# Patient Record
Sex: Male | Born: 1985 | Hispanic: No | Marital: Single | Smoking: Never smoker
Health system: Southern US, Community
[De-identification: ages and names within clinical notes are randomized; demographics above are authoritative.]

## PROBLEM LIST (undated history)

## (undated) HISTORY — PX: ABDOMINAL HYSTERECTOMY: SHX81

---

## 2015-08-07 ENCOUNTER — Emergency Department (HOSPITAL_COMMUNITY): Payer: Self-pay

## 2015-08-07 ENCOUNTER — Emergency Department (HOSPITAL_COMMUNITY): Payer: MEDICAID | Admitting: Anesthesiology

## 2015-08-07 ENCOUNTER — Inpatient Hospital Stay (HOSPITAL_COMMUNITY)
Admission: EM | Admit: 2015-08-07 | Discharge: 2015-08-20 | DRG: 004 | Payer: Self-pay | Attending: General Surgery | Admitting: General Surgery

## 2015-08-07 ENCOUNTER — Encounter (HOSPITAL_COMMUNITY): Payer: Self-pay | Admitting: *Deleted

## 2015-08-07 ENCOUNTER — Emergency Department (HOSPITAL_COMMUNITY): Payer: Self-pay | Admitting: Anesthesiology

## 2015-08-07 ENCOUNTER — Other Ambulatory Visit: Payer: Self-pay

## 2015-08-07 ENCOUNTER — Encounter (HOSPITAL_COMMUNITY): Admission: EM | Payer: Self-pay | Source: Home / Self Care

## 2015-08-07 DIAGNOSIS — S199XXA Unspecified injury of neck, initial encounter: Secondary | ICD-10-CM

## 2015-08-07 DIAGNOSIS — R739 Hyperglycemia, unspecified: Secondary | ICD-10-CM | POA: Diagnosis not present

## 2015-08-07 DIAGNOSIS — Z430 Encounter for attention to tracheostomy: Secondary | ICD-10-CM

## 2015-08-07 DIAGNOSIS — S15102A Unspecified injury of left vertebral artery, initial encounter: Secondary | ICD-10-CM | POA: Diagnosis present

## 2015-08-07 DIAGNOSIS — S12600B Unspecified displaced fracture of seventh cervical vertebra, initial encounter for open fracture: Secondary | ICD-10-CM | POA: Diagnosis present

## 2015-08-07 DIAGNOSIS — M25422 Effusion, left elbow: Secondary | ICD-10-CM

## 2015-08-07 DIAGNOSIS — J939 Pneumothorax, unspecified: Secondary | ICD-10-CM

## 2015-08-07 DIAGNOSIS — M25512 Pain in left shoulder: Secondary | ICD-10-CM

## 2015-08-07 DIAGNOSIS — W3400XA Accidental discharge from unspecified firearms or gun, initial encounter: Secondary | ICD-10-CM

## 2015-08-07 DIAGNOSIS — K5900 Constipation, unspecified: Secondary | ICD-10-CM | POA: Diagnosis not present

## 2015-08-07 DIAGNOSIS — D62 Acute posthemorrhagic anemia: Secondary | ICD-10-CM | POA: Diagnosis present

## 2015-08-07 DIAGNOSIS — S129XXA Fracture of neck, unspecified, initial encounter: Secondary | ICD-10-CM | POA: Diagnosis present

## 2015-08-07 DIAGNOSIS — S27819S Unspecified injury of esophagus (thoracic part), sequela: Secondary | ICD-10-CM

## 2015-08-07 DIAGNOSIS — S27819A Unspecified injury of esophagus (thoracic part), initial encounter: Secondary | ICD-10-CM | POA: Diagnosis present

## 2015-08-07 DIAGNOSIS — I4891 Unspecified atrial fibrillation: Secondary | ICD-10-CM | POA: Diagnosis not present

## 2015-08-07 DIAGNOSIS — M25522 Pain in left elbow: Secondary | ICD-10-CM

## 2015-08-07 DIAGNOSIS — S143XXA Injury of brachial plexus, initial encounter: Secondary | ICD-10-CM | POA: Diagnosis present

## 2015-08-07 DIAGNOSIS — J9690 Respiratory failure, unspecified, unspecified whether with hypoxia or hypercapnia: Secondary | ICD-10-CM

## 2015-08-07 DIAGNOSIS — S27818A Other injury of esophagus (thoracic part), initial encounter: Principal | ICD-10-CM | POA: Diagnosis present

## 2015-08-07 DIAGNOSIS — J982 Interstitial emphysema: Secondary | ICD-10-CM

## 2015-08-07 HISTORY — PX: TRACHEOSTOMY TUBE PLACEMENT: SHX814

## 2015-08-07 LAB — CDS SEROLOGY

## 2015-08-07 SURGERY — CREATION, TRACHEOSTOMY
Anesthesia: General | Site: Neck

## 2015-08-07 MED ORDER — SODIUM CHLORIDE 0.9 % IV SOLN
INTRAVENOUS | Status: DC | PRN
  Administered 2015-08-07: 22:00:00 via INTRAVENOUS

## 2015-08-07 MED ORDER — ETOMIDATE 2 MG/ML IV SOLN
0.3000 mg/kg | Freq: Once | INTRAVENOUS | Status: DC
  Administered 2015-08-07: 30 mg via INTRAVENOUS

## 2015-08-07 MED ORDER — FENTANYL CITRATE (PF) 250 MCG/5ML IJ SOLN
INTRAMUSCULAR | Status: AC
  Filled 2015-08-07: qty 5

## 2015-08-07 MED ORDER — IOHEXOL 350 MG/ML SOLN
100.0000 mL | Freq: Once | INTRAVENOUS | Status: AC | PRN
  Administered 2015-08-07: 100 mL via INTRAVENOUS

## 2015-08-07 MED ORDER — ROCURONIUM BROMIDE 100 MG/10ML IV SOLN
INTRAVENOUS | Status: DC | PRN
  Administered 2015-08-07: 50 mg via INTRAVENOUS

## 2015-08-07 MED ORDER — MIDAZOLAM HCL 2 MG/2ML IJ SOLN
INTRAMUSCULAR | Status: AC
Start: 2015-08-07 — End: 2015-08-07
  Administered 2015-08-07: 2 mg
  Filled 2015-08-07: qty 2

## 2015-08-07 MED ORDER — CEFAZOLIN SODIUM-DEXTROSE 2-3 GM-% IV SOLR
INTRAVENOUS | Status: DC | PRN
  Administered 2015-08-07: 2 g via INTRAVENOUS

## 2015-08-07 MED ORDER — FENTANYL CITRATE (PF) 250 MCG/5ML IJ SOLN
INTRAMUSCULAR | Status: DC | PRN
  Administered 2015-08-07: 100 ug via INTRAVENOUS
  Administered 2015-08-07: 50 ug via INTRAVENOUS
  Administered 2015-08-07: 100 ug via INTRAVENOUS

## 2015-08-07 MED ORDER — PHENYLEPHRINE HCL 10 MG/ML IJ SOLN
INTRAMUSCULAR | Status: DC | PRN
  Administered 2015-08-07 (×2): 40 ug via INTRAVENOUS

## 2015-08-07 MED ORDER — LIDOCAINE-EPINEPHRINE 1 %-1:100000 IJ SOLN
INTRAMUSCULAR | Status: AC
  Filled 2015-08-07: qty 1

## 2015-08-07 MED ORDER — PROPOFOL 10 MG/ML IV BOLUS
INTRAVENOUS | Status: AC
  Filled 2015-08-07: qty 40

## 2015-08-07 MED ORDER — PROPOFOL 1000 MG/100ML IV EMUL
INTRAVENOUS | Status: AC
Start: 2015-08-07 — End: 2015-08-07
  Administered 2015-08-07: 1000 mg
  Filled 2015-08-07: qty 100

## 2015-08-07 MED ORDER — SUCCINYLCHOLINE CHLORIDE 20 MG/ML IJ SOLN
100.0000 mg | Freq: Once | INTRAMUSCULAR | Status: AC
  Administered 2015-08-07: 100 mg via INTRAVENOUS

## 2015-08-07 MED ORDER — LACTATED RINGERS IV SOLN
INTRAVENOUS | Status: DC | PRN
  Administered 2015-08-07 (×2): via INTRAVENOUS

## 2015-08-07 MED ORDER — ROCURONIUM BROMIDE 50 MG/5ML IV SOLN: 1.0000 mg/kg | Freq: Once | INTRAVENOUS | Status: DC

## 2015-08-07 MED ORDER — ETOMIDATE 2 MG/ML IV SOLN
0.3000 mg/kg | Freq: Once | INTRAVENOUS | Status: DC
  Administered 2015-08-07: 20 mg via INTRAVENOUS

## 2015-08-07 SURGICAL SUPPLY — 32 items
BLADE SURG 15 STRL LF DISP TIS (BLADE) IMPLANT
BLADE SURG 15 STRL SS (BLADE)
CANISTER SUCTION 2500CC (MISCELLANEOUS) ×3 IMPLANT
CLEANER TIP ELECTROSURG 2X2 (MISCELLANEOUS) ×3 IMPLANT
COVER SURGICAL LIGHT HANDLE (MISCELLANEOUS) ×3 IMPLANT
CRADLE DONUT ADULT HEAD (MISCELLANEOUS) IMPLANT
DRSG TEGADERM 4X4.75 (GAUZE/BANDAGES/DRESSINGS) ×3 IMPLANT
ELECT CAUTERY BLADE 6.4 (BLADE) ×3 IMPLANT
ELECT REM PT RETURN 9FT ADLT (ELECTROSURGICAL) ×3
ELECTRODE REM PT RTRN 9FT ADLT (ELECTROSURGICAL) ×1 IMPLANT
GAUZE XEROFORM 1X8 LF (GAUZE/BANDAGES/DRESSINGS) ×3 IMPLANT
GLOVE BIO SURGEON STRL SZ7.5 (GLOVE) ×6 IMPLANT
GLOVE ECLIPSE 7.5 STRL STRAW (GLOVE) ×6 IMPLANT
GOWN STRL REUS W/ TWL LRG LVL3 (GOWN DISPOSABLE) ×2 IMPLANT
GOWN STRL REUS W/TWL LRG LVL3 (GOWN DISPOSABLE) ×4
KIT BASIN OR (CUSTOM PROCEDURE TRAY) ×3 IMPLANT
KIT ROOM TURNOVER OR (KITS) ×3 IMPLANT
NEEDLE 22X1 1/2 (OR ONLY) (NEEDLE) IMPLANT
NS IRRIG 1000ML POUR BTL (IV SOLUTION) ×3 IMPLANT
PAD ARMBOARD 7.5X6 YLW CONV (MISCELLANEOUS) ×6 IMPLANT
PENCIL BUTTON HOLSTER BLD 10FT (ELECTRODE) ×3 IMPLANT
PENCIL FOOT CONTROL (ELECTRODE) ×3 IMPLANT
SPONGE DRAIN TRACH 4X4 STRL 2S (GAUZE/BANDAGES/DRESSINGS) ×3 IMPLANT
SPONGE GAUZE 4X4 12PLY STER LF (GAUZE/BANDAGES/DRESSINGS) ×3 IMPLANT
SUT SILK 2 0 SH (SUTURE) ×9 IMPLANT
SYR CONTROL 10ML LL (SYRINGE) IMPLANT
TOWEL OR 17X24 6PK STRL BLUE (TOWEL DISPOSABLE) IMPLANT
TRAY ENT MC OR (CUSTOM PROCEDURE TRAY) ×3 IMPLANT
TUBE CONNECTING 12'X1/4 (SUCTIONS) ×1
TUBE CONNECTING 12X1/4 (SUCTIONS) ×2 IMPLANT
TUBE TRACH SHILEY 8 DIST CUF (TUBING) ×3 IMPLANT
YANKAUER SUCT BULB TIP NO VENT (SUCTIONS) ×3 IMPLANT

## 2015-08-07 NOTE — ED Notes (Signed)
Port chest 

## 2015-08-07 NOTE — ED Notes (Signed)
meds given to intubate  Difficult intubation
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
## 2015-08-07 NOTE — ED Notes (Signed)
Rolling the pt for surgeon to inspect
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
## 2015-08-07 NOTE — ED Notes (Signed)
Pro  25 mcg  75 kg estimated  Drip started   11.3 ml

## 2015-08-07 NOTE — ED Notes (Signed)
2 liters nss hung approx 45 minutes  ago

## 2015-08-07 NOTE — ED Notes (Signed)
Etomidate 20mg   Roc  70mg  iv matt rn

## 2015-08-07 NOTE — ED Notes (Signed)
Pt given etomidate 30 mg and succ 100mg 
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
## 2015-08-07 NOTE — ED Notes (Signed)
To c-t for ctas then to the or
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
## 2015-08-07 NOTE — ED Notes (Signed)
Pt  Being bagged through the cric

## 2015-08-07 NOTE — ED Notes (Signed)
Waiting for or to get ready

## 2015-08-08 ENCOUNTER — Encounter (HOSPITAL_COMMUNITY): Payer: Self-pay | Admitting: Radiology

## 2015-08-08 ENCOUNTER — Inpatient Hospital Stay (HOSPITAL_COMMUNITY): Payer: Self-pay

## 2015-08-08 DIAGNOSIS — W3400XA Accidental discharge from unspecified firearms or gun, initial encounter: Secondary | ICD-10-CM

## 2015-08-08 LAB — PREPARE FRESH FROZEN PLASMA
UNIT DIVISION: 0
Unit division: 0

## 2015-08-08 LAB — COMPREHENSIVE METABOLIC PANEL
ALK PHOS: 54 U/L (ref 38–126)
ALT: 30 U/L (ref 17–63)
ANION GAP: 15 (ref 5–15)
AST: 36 U/L (ref 15–41)
Albumin: 4 g/dL (ref 3.5–5.0)
BUN: 9 mg/dL (ref 6–20)
CALCIUM: 9.3 mg/dL (ref 8.9–10.3)
CHLORIDE: 108 mmol/L (ref 101–111)
CO2: 20 mmol/L — AB (ref 22–32)
Creatinine, Ser: 1.16 mg/dL (ref 0.61–1.24)
Glucose, Bld: 130 mg/dL — ABNORMAL HIGH (ref 65–99)
POTASSIUM: 3.2 mmol/L — AB (ref 3.5–5.1)
SODIUM: 143 mmol/L (ref 135–145)
Total Bilirubin: 0.4 mg/dL (ref 0.3–1.2)
Total Protein: 6.8 g/dL (ref 6.5–8.1)

## 2015-08-08 LAB — BASIC METABOLIC PANEL
ANION GAP: 14 (ref 5–15)
BUN: 7 mg/dL (ref 6–20)
CO2: 25 mmol/L (ref 22–32)
Calcium: 8.8 mg/dL — ABNORMAL LOW (ref 8.9–10.3)
Chloride: 106 mmol/L (ref 101–111)
Creatinine, Ser: 1.17 mg/dL (ref 0.61–1.24)
Glucose, Bld: 136 mg/dL — ABNORMAL HIGH (ref 65–99)
POTASSIUM: 4.4 mmol/L (ref 3.5–5.1)
SODIUM: 145 mmol/L (ref 135–145)

## 2015-08-08 LAB — MRSA PCR SCREENING: MRSA by PCR: NEGATIVE

## 2015-08-08 LAB — POCT I-STAT 3, ART BLOOD GAS (G3+)
Acid-base deficit: 7 mmol/L — ABNORMAL HIGH (ref 0.0–2.0)
Bicarbonate: 21.7 meq/L (ref 20.0–24.0)
O2 SAT: 96 %
PCO2 ART: 51.2 mmHg — AB (ref 35.0–45.0)
PH ART: 7.224 — AB (ref 7.350–7.450)
Patient temperature: 35.1
TCO2: 23 mmol/L (ref 0–100)
pO2, Arterial: 90 mmHg (ref 80.0–100.0)

## 2015-08-08 LAB — TYPE AND SCREEN
ABO/RH(D): O POS
ANTIBODY SCREEN: NEGATIVE
Unit division: 0
Unit division: 0

## 2015-08-08 LAB — CBC
HCT: 46.2 % (ref 39.0–52.0)
HEMATOCRIT: 46.4 % (ref 39.0–52.0)
HEMOGLOBIN: 15.2 g/dL (ref 13.0–17.0)
HEMOGLOBIN: 15.6 g/dL (ref 13.0–17.0)
MCH: 28.9 pg (ref 26.0–34.0)
MCH: 28.8 pg (ref 26.0–34.0)
MCHC: 32.9 g/dL (ref 30.0–36.0)
MCHC: 33.6 g/dL (ref 30.0–36.0)
MCV: 85.6 fL (ref 78.0–100.0)
MCV: 87.8 fL (ref 78.0–100.0)
PLATELETS: 245 10*3/uL (ref 150–400)
Platelets: 262 10*3/uL (ref 150–400)
RBC: 5.26 MIL/uL (ref 4.22–5.81)
RBC: 5.42 MIL/uL (ref 4.22–5.81)
RDW: 12.6 % (ref 11.5–15.5)
RDW: 12.8 % (ref 11.5–15.5)
WBC: 16.3 10*3/uL — ABNORMAL HIGH (ref 4.0–10.5)
WBC: 26.9 10*3/uL — AB (ref 4.0–10.5)

## 2015-08-08 LAB — PROTIME-INR
INR: 0.92 (ref 0.00–1.49)
PROTHROMBIN TIME: 12.6 s (ref 11.6–15.2)

## 2015-08-08 LAB — ETHANOL: ALCOHOL ETHYL (B): 46 mg/dL — AB (ref <5)

## 2015-08-08 LAB — ABO/RH: ABO/RH(D): O POS

## 2015-08-08 MED ORDER — ENOXAPARIN SODIUM 40 MG/0.4ML ~~LOC~~ SOLN
40.0000 mg | Freq: Every day | SUBCUTANEOUS | Status: DC
  Administered 2015-08-08 – 2015-08-19 (×12): 40 mg via SUBCUTANEOUS
  Filled 2015-08-08 (×12): qty 0.4

## 2015-08-08 MED ORDER — ANTISEPTIC ORAL RINSE SOLUTION (CORINZ)
7.0000 mL | OROMUCOSAL | Status: DC
  Administered 2015-08-08 – 2015-08-12 (×44): 7 mL via OROMUCOSAL

## 2015-08-08 MED ORDER — CHLORHEXIDINE GLUCONATE 0.12% ORAL RINSE (MEDLINE KIT)
15.0000 mL | Freq: Two times a day (BID) | OROMUCOSAL | Status: DC
  Administered 2015-08-08 – 2015-08-12 (×11): 15 mL via OROMUCOSAL

## 2015-08-08 MED ORDER — SODIUM CHLORIDE 0.9 % IV SOLN
10.0000 ug/h | INTRAVENOUS | Status: DC
  Administered 2015-08-08 (×2): 150 ug/h via INTRAVENOUS
  Administered 2015-08-09: 100 ug/h via INTRAVENOUS
  Administered 2015-08-10: 125 ug/h via INTRAVENOUS
  Administered 2015-08-11: 100 ug/h via INTRAVENOUS
  Filled 2015-08-08 (×6): qty 50

## 2015-08-08 MED ORDER — FENTANYL CITRATE (PF) 100 MCG/2ML IJ SOLN
100.0000 ug | Freq: Once | INTRAMUSCULAR | Status: AC
  Administered 2015-08-08: 100 ug via INTRAVENOUS

## 2015-08-08 MED ORDER — FENTANYL BOLUS VIA INFUSION
25.0000 ug | INTRAVENOUS | Status: DC | PRN
  Administered 2015-08-09 (×2): 25 ug via INTRAVENOUS
  Administered 2015-08-10 – 2015-08-13 (×5): 50 ug via INTRAVENOUS
  Filled 2015-08-08: qty 50

## 2015-08-08 MED ORDER — FENTANYL CITRATE (PF) 100 MCG/2ML IJ SOLN
INTRAMUSCULAR | Status: AC
  Filled 2015-08-08: qty 2

## 2015-08-08 MED ORDER — AMPICILLIN-SULBACTAM SODIUM 3 (2-1) G IJ SOLR
3.0000 g | Freq: Four times a day (QID) | INTRAMUSCULAR | Status: DC
  Administered 2015-08-08 – 2015-08-20 (×48): 3 g via INTRAVENOUS
  Filled 2015-08-08 (×54): qty 3

## 2015-08-08 MED ORDER — IOHEXOL 300 MG/ML  SOLN
75.0000 mL | Freq: Once | INTRAMUSCULAR | Status: AC | PRN
  Administered 2015-08-08: 75 mL via INTRAVENOUS

## 2015-08-08 MED ORDER — SODIUM CHLORIDE 0.9 % IV SOLN
0.0000 mg/h | INTRAVENOUS | Status: DC
  Administered 2015-08-08: 5 mg/h via INTRAVENOUS
  Administered 2015-08-08: 4 mg/h via INTRAVENOUS
  Administered 2015-08-08 – 2015-08-09 (×2): 5 mg/h via INTRAVENOUS
  Administered 2015-08-09: 3 mg/h via INTRAVENOUS
  Administered 2015-08-09: 2 mg/h via INTRAVENOUS
  Administered 2015-08-10: 3 mg/h via INTRAVENOUS
  Filled 2015-08-08 (×7): qty 10

## 2015-08-08 MED ORDER — DEXTROSE-NACL 5-0.9 % IV SOLN
INTRAVENOUS | Status: DC
  Administered 2015-08-08 – 2015-08-10 (×8): via INTRAVENOUS
  Administered 2015-08-13: 100 mL/h via INTRAVENOUS
  Administered 2015-08-14: 13:00:00 via INTRAVENOUS

## 2015-08-08 MED ORDER — MIDAZOLAM BOLUS VIA INFUSION
1.0000 mg | INTRAVENOUS | Status: DC | PRN
  Administered 2015-08-09 – 2015-08-10 (×3): 1 mg via INTRAVENOUS
  Filled 2015-08-08 (×4): qty 2

## 2015-08-08 NOTE — Progress Notes (Signed)
~  2045: [facility], Officer [place] about family visitation. Informed Officer that this nurse was informed that only the mother and sister (that was present Corina) was allowed to visit. Officer double checked on his end and said there was no issues with another sister visiting.  Officer Lorin PicketScott, [employer], discussed about visitation. Made clear that only 2 visitors at the time and immediate family only to visit pt. Will update mother in room and allow 2nd sister to visit.
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
## 2015-08-08 NOTE — Progress Notes (Signed)
CT cspine reviewed. The C7/T1 facets are aligned, no intracanal deformities appreciated. Alignment of cspine overall is normal. The Transverse process of C7 has been obliterated, but no structural issues at play. Would leave in collar for now. Neurological exam noted to be normal.

## 2015-08-08 NOTE — Progress Notes (Signed)
UR Completed. Anandi Abramo, RN, BSN.  [phone] 

## 2015-08-08 NOTE — Progress Notes (Signed)
Officer [place] [employer] identified patient as Dustin Heaporman Wayne House of [address], [name]. DOB 26-Jul-1985. SSN: [id number]  Detective Drue SecondSnider to return Monday to attempt to speak with patient. If needed, his number is [phone]

## 2015-08-08 NOTE — Anesthesia Postprocedure Evaluation (Signed)
Anesthesia Post Note  Patient: Dustin House  Procedure(s) Performed: Procedure(s) (LRB): TRACHEOSTOMY attempted bronchoscopy, closure of neck wound  (N/A)  Patient location during evaluation: PACU Anesthesia Type: General Post-procedure mental status: Trached and sedated. Pain management: pain level controlled Vital Signs Assessment: post-procedure vital signs reviewed and stable Respiratory status: Pt remains trached. Cardiovascular status: blood pressure returned to baseline and stable Postop Assessment: no signs of nausea or vomiting Anesthetic complications: no    Last Vitals:  Filed Vitals:   08/08/15 0145 08/08/15 0200  BP: 83/51 88/54  Pulse: 105 105  Temp:    Resp: 14 14    Last Pain:  Filed Vitals:   08/08/15 0206  PainSc: 10-Worst pain ever                 Kennieth RadFitzgerald, Jaylyn Iyer E
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
## 2015-08-08 NOTE — Progress Notes (Signed)
BP 91/60 mmHg  Pulse 104  Temp(Src) 100.4 F (38 C) (Axillary)  Resp 14  Ht 6\' 1"  (1.854 m)  Wt 179 lb 3.7 oz (81.3 kg)  BMI 23.65 kg/m2  SpO2 99% Alert and  follows commands Moving all extremities Remain in collar.

## 2015-08-08 NOTE — Progress Notes (Signed)
Transported pt to ct and back on ventilator.  Trip was uneventful

## 2015-08-09 ENCOUNTER — Encounter (HOSPITAL_COMMUNITY): Payer: Self-pay | Admitting: General Surgery

## 2015-08-09 ENCOUNTER — Inpatient Hospital Stay (HOSPITAL_COMMUNITY): Payer: Self-pay

## 2015-08-09 ENCOUNTER — Encounter (HOSPITAL_COMMUNITY): Admission: EM | Payer: Self-pay | Source: Home / Self Care

## 2015-08-09 ENCOUNTER — Inpatient Hospital Stay (HOSPITAL_COMMUNITY): Payer: MEDICAID

## 2015-08-09 HISTORY — PX: ESOPHAGOGASTRODUODENOSCOPY: SHX5428

## 2015-08-09 LAB — CBC
HCT: 36.4 % — ABNORMAL LOW (ref 39.0–52.0)
HEMOGLOBIN: 12.2 g/dL — AB (ref 13.0–17.0)
MCH: 29.5 pg (ref 26.0–34.0)
MCHC: 33.5 g/dL (ref 30.0–36.0)
MCV: 87.9 fL (ref 78.0–100.0)
Platelets: 124 10*3/uL — ABNORMAL LOW (ref 150–400)
RBC: 4.14 MIL/uL — AB (ref 4.22–5.81)
RDW: 13.4 % (ref 11.5–15.5)
WBC: 14.2 10*3/uL — AB (ref 4.0–10.5)

## 2015-08-09 LAB — BASIC METABOLIC PANEL
ANION GAP: 10 (ref 5–15)
CHLORIDE: 111 mmol/L (ref 101–111)
CO2: 23 mmol/L (ref 22–32)
Calcium: 7.9 mg/dL — ABNORMAL LOW (ref 8.9–10.3)
Creatinine, Ser: 1.04 mg/dL (ref 0.61–1.24)
GFR calc Af Amer: >60 mL/min (ref >60)
Glucose, Bld: 134 mg/dL — ABNORMAL HIGH (ref 65–99)
POTASSIUM: 3.5 mmol/L (ref 3.5–5.1)
SODIUM: 144 mmol/L (ref 135–145)

## 2015-08-09 LAB — BLOOD PRODUCT ORDER (VERBAL) VERIFICATION

## 2015-08-09 SURGERY — EGD (ESOPHAGOGASTRODUODENOSCOPY)
Anesthesia: Moderate Sedation

## 2015-08-09 MED ORDER — VECURONIUM BROMIDE 10 MG IV SOLR
10.0000 mg | Freq: Once | INTRAVENOUS | Status: AC
  Administered 2015-08-09: 10 mg via INTRAVENOUS

## 2015-08-09 MED ORDER — MIDAZOLAM BOLUS VIA INFUSION
2.0000 mg | Freq: Once | INTRAVENOUS | Status: AC
  Administered 2015-08-09: 2 mg via INTRAVENOUS

## 2015-08-09 MED ORDER — FENTANYL BOLUS VIA INFUSION
100.0000 ug | Freq: Once | INTRAVENOUS | Status: AC
  Administered 2015-08-09: 100 ug via INTRAVENOUS

## 2015-08-09 MED ORDER — ACETAMINOPHEN 650 MG RE SUPP
650.0000 mg | Freq: Four times a day (QID) | RECTAL | Status: DC | PRN
  Administered 2015-08-10: 650 mg via RECTAL
  Filled 2015-08-09: qty 1

## 2015-08-09 MED ORDER — VECURONIUM BROMIDE 10 MG IV SOLR
INTRAVENOUS | Status: AC
  Administered 2015-08-09: 10 mg via INTRAVENOUS
  Filled 2015-08-09: qty 10

## 2015-08-09 MED ORDER — INFLUENZA VAC SPLIT QUAD 0.5 ML IM SUSY
0.5000 mL | PREFILLED_SYRINGE | INTRAMUSCULAR | Status: AC
  Administered 2015-08-10: 0.5 mL via INTRAMUSCULAR
  Filled 2015-08-09: qty 0.5
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
## 2015-08-09 NOTE — Progress Notes (Signed)
Patient ID: Dustin House, male   DOB: 07-09-1985, 30 y.o.   MRN: [id number] GI not comfortable with flex EGD (air insufflation). We will need gastrograffin swallow when able.
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
## 2015-08-09 NOTE — Progress Notes (Addendum)
BP 135/69 mmHg  Pulse 109  Temp(Src) 100.3 F (37.9 C) (Oral)  Resp 13  Ht 6\' 1"  (1.854 m)  Wt 179 lb 3.7 oz (81.3 kg)  BMI 23.65 kg/m2  SpO2 100% Alert, does have sedation running Moving all extremities, edema in left upper extremity, especially left hand Follows all commands No new recs. Neck can be extended if needed for diagnostic evaluation.

## 2015-08-09 NOTE — Progress Notes (Signed)
VSS during bedside esophageal gastroscopy. See flowsheet.

## 2015-08-10 MED ORDER — PANTOPRAZOLE SODIUM 40 MG IV SOLR
40.0000 mg | Freq: Every day | INTRAVENOUS | Status: DC
  Administered 2015-08-10 – 2015-08-14 (×5): 40 mg via INTRAVENOUS
  Filled 2015-08-10 (×5): qty 40
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
## 2015-08-10 NOTE — Progress Notes (Signed)
Arrived to discuss PICC placement with patient. When discussing what a PICC was and where the tip would lie, the patient motioned that he did not want a PICC. Primary RN notified.

## 2015-08-10 NOTE — Progress Notes (Signed)
Patient ID: Dustin House, male   DOB: 12/12/1985, 29 y.o.   MRN: [id number] He is awake and alert, able to answer questions by nodding. He has been swallowing saliva without difficulty. He has less subcut air in the neck today. There is no swelling or signs of abscess. Will schedule for triple endoscopy later today or tomorrow depending on availability. 

## 2015-08-10 NOTE — Progress Notes (Signed)
Patient is currently off of the ventilator and on a 40% trach collar. RT will continue to monitor.
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
## 2015-08-10 NOTE — Progress Notes (Signed)
eLink Physician-Brief Progress Note Patient Name: Dustin House DOB: Apr 16, 1986 MRN: [id number]   Date of Service  08/10/2015  HPI/Events of Note  Intubated and mechanically ventilated. No stress ulcer prophylaxis.   eICU Interventions  Will order Protonix IV.      Intervention Category Intermediate Interventions: Best-practice therapies (e.g. DVT, beta blocker, etc.)  [name] 08/10/2015, 4:06 AM

## 2015-08-11 ENCOUNTER — Encounter (HOSPITAL_COMMUNITY): Payer: Self-pay | Admitting: *Deleted

## 2015-08-11 ENCOUNTER — Inpatient Hospital Stay (HOSPITAL_COMMUNITY): Payer: Self-pay

## 2015-08-11 ENCOUNTER — Inpatient Hospital Stay (HOSPITAL_COMMUNITY): Payer: Self-pay | Admitting: Anesthesiology

## 2015-08-11 ENCOUNTER — Inpatient Hospital Stay (HOSPITAL_COMMUNITY): Payer: MEDICAID | Admitting: Anesthesiology

## 2015-08-11 ENCOUNTER — Ambulatory Visit: Payer: Self-pay | Admitting: Otolaryngology

## 2015-08-11 ENCOUNTER — Encounter (HOSPITAL_COMMUNITY): Admission: EM | Payer: Self-pay | Source: Home / Self Care

## 2015-08-11 HISTORY — PX: RIGID ESOPHAGOSCOPY: SHX5226

## 2015-08-11 HISTORY — PX: DIRECT LARYNGOSCOPY: SHX5326

## 2015-08-11 SURGERY — LARYNGOSCOPY, DIRECT
Anesthesia: General

## 2015-08-11 MED ORDER — BACITRACIN ZINC 500 UNIT/GM EX OINT
TOPICAL_OINTMENT | CUTANEOUS | Status: AC
  Filled 2015-08-11: qty 28.35

## 2015-08-11 MED ORDER — ROCURONIUM BROMIDE 100 MG/10ML IV SOLN
INTRAVENOUS | Status: DC | PRN
  Administered 2015-08-11: 50 mg via INTRAVENOUS

## 2015-08-11 MED ORDER — SODIUM CHLORIDE 0.9% FLUSH
10.0000 mL | Freq: Two times a day (BID) | INTRAVENOUS | Status: DC
  Administered 2015-08-11 – 2015-08-20 (×9): 10 mL

## 2015-08-11 MED ORDER — PROMETHAZINE HCL 25 MG/ML IJ SOLN
12.5000 mg | INTRAMUSCULAR | Status: DC | PRN
  Administered 2015-08-11: 12.5 mg via INTRAVENOUS
  Filled 2015-08-11: qty 1

## 2015-08-11 MED ORDER — LIDOCAINE-EPINEPHRINE 1 %-1:100000 IJ SOLN
INTRAMUSCULAR | Status: AC
  Filled 2015-08-11: qty 1

## 2015-08-11 MED ORDER — MIDAZOLAM HCL 2 MG/2ML IJ SOLN
INTRAMUSCULAR | Status: AC
  Filled 2015-08-11: qty 2

## 2015-08-11 MED ORDER — SUGAMMADEX SODIUM 500 MG/5ML IV SOLN
INTRAVENOUS | Status: DC | PRN
  Administered 2015-08-11: 325.2 mg via INTRAVENOUS

## 2015-08-11 MED ORDER — SODIUM CHLORIDE 0.9% FLUSH
10.0000 mL | INTRAVENOUS | Status: DC | PRN
  Administered 2015-08-16 – 2015-08-17 (×2): 10 mL
  Filled 2015-08-11 (×2): qty 40

## 2015-08-11 MED ORDER — LIDOCAINE HCL (CARDIAC) 20 MG/ML IV SOLN
INTRAVENOUS | Status: DC | PRN
  Administered 2015-08-11: 80 mg via INTRAVENOUS

## 2015-08-11 MED ORDER — 0.9 % SODIUM CHLORIDE (POUR BTL) OPTIME
TOPICAL | Status: DC | PRN
  Administered 2015-08-11: 1000 mL

## 2015-08-11 MED ORDER — ONDANSETRON HCL 4 MG/2ML IJ SOLN
INTRAMUSCULAR | Status: DC | PRN
  Administered 2015-08-11: 4 mg via INTRAVENOUS

## 2015-08-11 MED ORDER — DEXMEDETOMIDINE HCL IN NACL 200 MCG/50ML IV SOLN
INTRAVENOUS | Status: AC
  Filled 2015-08-11: qty 50

## 2015-08-11 MED ORDER — FENTANYL CITRATE (PF) 100 MCG/2ML IJ SOLN
INTRAMUSCULAR | Status: DC | PRN
  Administered 2015-08-11: 250 ug via INTRAVENOUS

## 2015-08-11 MED ORDER — FENTANYL CITRATE (PF) 250 MCG/5ML IJ SOLN
INTRAMUSCULAR | Status: AC
  Filled 2015-08-11: qty 5

## 2015-08-11 MED ORDER — DEXMEDETOMIDINE HCL 200 MCG/2ML IV SOLN
INTRAVENOUS | Status: DC | PRN
  Administered 2015-08-11 (×3): 12 ug via INTRAVENOUS

## 2015-08-11 MED ORDER — LORAZEPAM 2 MG/ML IJ SOLN
0.5000 mg | INTRAMUSCULAR | Status: DC | PRN
  Administered 2015-08-11 – 2015-08-20 (×35): 1 mg via INTRAVENOUS
  Filled 2015-08-11 (×35): qty 1

## 2015-08-11 MED ORDER — MIDAZOLAM HCL 5 MG/5ML IJ SOLN
INTRAMUSCULAR | Status: DC | PRN
  Administered 2015-08-11: 2 mg via INTRAVENOUS

## 2015-08-11 MED ORDER — SUGAMMADEX SODIUM 500 MG/5ML IV SOLN
INTRAVENOUS | Status: AC
  Filled 2015-08-11: qty 5

## 2015-08-11 MED ORDER — PROPOFOL 10 MG/ML IV BOLUS
INTRAVENOUS | Status: AC
  Filled 2015-08-11: qty 20

## 2015-08-11 MED ORDER — LACTATED RINGERS IV SOLN
INTRAVENOUS | Status: DC | PRN
  Administered 2015-08-11: 12:00:00 via INTRAVENOUS

## 2015-08-11 SURGICAL SUPPLY — 61 items
ATTRACTOMAT 16X20 MAGNETIC DRP (DRAPES) IMPLANT
BALLN PULM 15 16.5 18 X 75CM (BALLOONS) ×1
BALLN PULM 15 16.5 18X75 (BALLOONS) ×2
BALLOON PULM 15 16.5 18X75 (BALLOONS) ×1 IMPLANT
BLADE SURG 15 STRL LF DISP TIS (BLADE) IMPLANT
BLADE SURG 15 STRL SS (BLADE)
BNDG CONFORM 2 STRL LF (GAUZE/BANDAGES/DRESSINGS) IMPLANT
BNDG GAUZE ELAST 4 BULKY (GAUZE/BANDAGES/DRESSINGS) IMPLANT
CANISTER SUCTION 2500CC (MISCELLANEOUS) ×3 IMPLANT
CATH ROBINSON RED A/P 16FR (CATHETERS) IMPLANT
CLEANER TIP ELECTROSURG 2X2 (MISCELLANEOUS) ×3 IMPLANT
CONT SPEC 4OZ CLIKSEAL STRL BL (MISCELLANEOUS) ×3 IMPLANT
COVER MAYO STAND STRL (DRAPES) ×3 IMPLANT
COVER SURGICAL LIGHT HANDLE (MISCELLANEOUS) ×3 IMPLANT
COVER TABLE BACK 60X90 (DRAPES) ×3 IMPLANT
DRAIN PENROSE 1/4X12 LTX STRL (WOUND CARE) ×3 IMPLANT
DRAPE PROXIMA HALF (DRAPES) ×3 IMPLANT
DRSG EMULSION OIL 3X3 NADH (GAUZE/BANDAGES/DRESSINGS) ×3 IMPLANT
ELECT COATED BLADE 2.86 ST (ELECTRODE) ×3 IMPLANT
ELECT NEEDLE TIP 2.8 STRL (NEEDLE) IMPLANT
ELECT REM PT RETURN 9FT ADLT (ELECTROSURGICAL) ×3
ELECTRODE REM PT RTRN 9FT ADLT (ELECTROSURGICAL) ×1 IMPLANT
GAUZE PACKING IODOFORM 1/4X5 (PACKING) ×3 IMPLANT
GAUZE SPONGE 4X4 12PLY STRL (GAUZE/BANDAGES/DRESSINGS) ×3 IMPLANT
GAUZE SPONGE 4X4 16PLY XRAY LF (GAUZE/BANDAGES/DRESSINGS) ×3 IMPLANT
GLOVE ECLIPSE 7.5 STRL STRAW (GLOVE) ×3 IMPLANT
GOWN STRL REUS W/ TWL LRG LVL3 (GOWN DISPOSABLE) ×2 IMPLANT
GOWN STRL REUS W/TWL LRG LVL3 (GOWN DISPOSABLE) ×4
GUARD TEETH (MISCELLANEOUS) IMPLANT
H R LUBE JELLY XXX (MISCELLANEOUS) ×3 IMPLANT
HOLDER TRACH TUBE VELCRO 19.5 (MISCELLANEOUS) ×3 IMPLANT
KIT BASIN OR (CUSTOM PROCEDURE TRAY) ×3 IMPLANT
KIT ROOM TURNOVER OR (KITS) ×3 IMPLANT
MARKER SKIN DUAL TIP RULER LAB (MISCELLANEOUS) ×3 IMPLANT
NEEDLE 27GAX1X1/2 (NEEDLE) IMPLANT
NEEDLE HYPO 30X.5 LL (NEEDLE) ×3 IMPLANT
NS IRRIG 1000ML POUR BTL (IV SOLUTION) ×3 IMPLANT
PAD ARMBOARD 7.5X6 YLW CONV (MISCELLANEOUS) ×6 IMPLANT
PATTIES SURGICAL .5 X3 (DISPOSABLE) ×3 IMPLANT
PENCIL FOOT CONTROL (ELECTRODE) ×3 IMPLANT
POUCH STERILIZING 3 X22 (STERILIZATION PRODUCTS) IMPLANT
SOLUTION ANTI FOG 6CC (MISCELLANEOUS) IMPLANT
SPECIMEN JAR SMALL (MISCELLANEOUS) ×3 IMPLANT
SUT CHROMIC 4 0 P 3 18 (SUTURE) ×3 IMPLANT
SUT ETHILON 4 0 PS 2 18 (SUTURE) ×3 IMPLANT
SUT ETHILON 5 0 P 3 18 (SUTURE) ×2
SUT NYLON ETHILON 5-0 P-3 1X18 (SUTURE) ×1 IMPLANT
SUT SILK 4 0 REEL (SUTURE) ×3 IMPLANT
SWAB COLLECTION DEVICE MRSA (MISCELLANEOUS) ×3 IMPLANT
SYR BULB IRRIGATION 50ML (SYRINGE) IMPLANT
SYR CONTROL 10ML LL (SYRINGE) ×3 IMPLANT
SYR TB 1ML LUER SLIP (SYRINGE) IMPLANT
TOWEL OR 17X24 6PK STRL BLUE (TOWEL DISPOSABLE) ×3 IMPLANT
TOWEL OR 17X26 10 PK STRL BLUE (TOWEL DISPOSABLE) ×3 IMPLANT
TRAY ENT MC OR (CUSTOM PROCEDURE TRAY) ×3 IMPLANT
TUBE ANAEROBIC SPECIMEN COL (MISCELLANEOUS) ×3 IMPLANT
TUBE CONNECTING 12'X1/4 (SUCTIONS) ×1
TUBE CONNECTING 12X1/4 (SUCTIONS) ×2 IMPLANT
TUBE TRACH SHILEY 8 NFEN UNCF (TUBING) ×3 IMPLANT
WATER STERILE IRR 1000ML POUR (IV SOLUTION) ×3 IMPLANT
YANKAUER SUCT BULB TIP NO VENT (SUCTIONS) IMPLANT

## 2015-08-11 NOTE — Progress Notes (Signed)
Patient ID: Dustin House, male   DOB: [birth year], 30 y.o.   MRN: [id number]   Barium swallow reviewed. Very small leak posterior left side. Recommend keep NPO and start tube feeds. Wil re-evaluate with another swallow in a week. No clinical need to drain the neck. Leak appears very small is is likely going to self-seal.

## 2015-08-11 NOTE — Progress Notes (Signed)
BP 126/59 mmHg  Pulse 69  Temp(Src) 98.2 F (36.8 C) (Oral)  Resp 17  Ht 6\' 1"  (1.854 m)  Wt 81.3 kg (179 lb 3.7 oz)  BMI 23.65 kg/m2  SpO2 100% Alert oriented Has had multiple procedures today Stable exam

## 2015-08-11 NOTE — Anesthesia Procedure Notes (Signed)
Date/Time: 08/11/2015 1:15 PM Performed by: Marena ChancyBECKNER, Anthon Harpole S Pre-anesthesia Checklist: Patient identified, Patient being monitored, Emergency Drugs available, Timeout performed and Suction available Patient Re-evaluated:Patient Re-evaluated prior to inductionOxygen Delivery Method: Circle system utilized Preoxygenation: Pre-oxygenation with 100% oxygen Intubation Type: Tracheostomy and Inhalational induction
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
## 2015-08-11 NOTE — Transfer of Care (Signed)
Immediate Anesthesia Transfer of Care Note  Patient: Dustin House  Procedure(s) Performed: Procedure(s): DIRECT LARYNGOSCOPY (N/A) RIGID ESOPHAGOSCOPY (N/A)  Patient Location: PACU  Anesthesia Type:General  Level of Consciousness: awake, alert  and oriented  Airway & Oxygen Therapy: Patient Spontanous Breathing and Patient connected to tracheostomy mask oxygen  Post-op Assessment: Report given to RN and Post -op Vital signs reviewed and stable  Post vital signs: Reviewed and stable  Last Vitals:  Filed Vitals:   08/11/15 1143 08/11/15 1400  BP: 124/72 142/98  Pulse: 79 76  Temp:  36.7 C  Resp: 19 26    Complications: No apparent anesthesia complications
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
## 2015-08-11 NOTE — Anesthesia Postprocedure Evaluation (Addendum)
Anesthesia Post Note  Patient: Dustin House  Procedure(s) Performed: Procedure(s) (LRB): DIRECT LARYNGOSCOPY (N/A) RIGID ESOPHAGOSCOPY (N/A)  Patient location during evaluation: SICU Anesthesia Type: General Level of consciousness: sedated Pain management: pain level controlled Vital Signs Assessment: post-procedure vital signs reviewed and stable Respiratory status: patient remains intubated per anesthesia plan (trach) Cardiovascular status: stable Postop Assessment: no signs of nausea or vomiting Anesthetic complications: no    Last Vitals:  Filed Vitals:   08/11/15 1519 08/11/15 1520  BP: 126/59 126/59  Pulse: 79 69  Temp:    Resp: 15 17    Last Pain:  Filed Vitals:   08/11/15 1603  PainSc: Asleep                 Jamoni Broadfoot
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
## 2015-08-11 NOTE — H&P (View-Only) (Signed)
Patient ID: Dustin House, male   DOB: 1985/07/04, 30 y.o.   MRN: [id number] He is awake and alert, able to answer questions by nodding. He has been swallowing saliva without difficulty. He has less subcut air in the neck today. There is no swelling or signs of abscess. Will schedule for triple endoscopy later today or tomorrow depending on availability.

## 2015-08-11 NOTE — Progress Notes (Signed)
Peripherally Inserted Central Catheter/Midline Placement  The IV Nurse has discussed with the patient and/or persons authorized to consent for the patient, the purpose of this procedure and the potential benefits and risks involved with this procedure.  The benefits include less needle sticks, lab draws from the catheter and patient may be discharged home with the catheter.  Risks include, but not limited to, infection, bleeding, blood clot (thrombus formation), and puncture of an artery; nerve damage and irregular heat beat.  Alternatives to this procedure were also discussed.  Consent obtained by Lazarus Gowdaenee Newman, RN  PICC/Midline Placement Documentation  PICC Double Lumen 08/11/15 PICC Right Basilic 43 cm 1 cm (Active)  Indication for Insertion or Continuance of Line Limited venous access - need for IV therapy >5 days (PICC only) 08/11/2015  5:23 PM  Exposed Catheter (cm) 1 cm 08/11/2015  5:23 PM  Dressing Change Due 08/18/15 08/11/2015  5:23 PM       Deziree Mokry, Lajean ManesKerry Loraine 08/11/2015, 5:24 PM
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
## 2015-08-12 ENCOUNTER — Encounter (HOSPITAL_COMMUNITY): Payer: Self-pay | Admitting: Otolaryngology

## 2015-08-12 ENCOUNTER — Inpatient Hospital Stay (HOSPITAL_COMMUNITY): Payer: Self-pay

## 2015-08-12 LAB — CBC WITH DIFFERENTIAL/PLATELET
Basophils Absolute: 0 10*3/uL (ref 0.0–0.1)
Basophils Relative: 0 %
EOS ABS: 0.2 10*3/uL (ref 0.0–0.7)
EOS PCT: 3 %
HCT: 32.6 % — ABNORMAL LOW (ref 39.0–52.0)
Hemoglobin: 11.3 g/dL — ABNORMAL LOW (ref 13.0–17.0)
LYMPHS ABS: 1.6 10*3/uL (ref 0.7–4.0)
Lymphocytes Relative: 22 %
MCH: 29.2 pg (ref 26.0–34.0)
MCHC: 34.7 g/dL (ref 30.0–36.0)
MCV: 84.2 fL (ref 78.0–100.0)
Monocytes Absolute: 0.7 10*3/uL (ref 0.1–1.0)
Monocytes Relative: 9 %
Neutro Abs: 4.7 10*3/uL (ref 1.7–7.7)
Neutrophils Relative %: 66 %
PLATELETS: 182 10*3/uL (ref 150–400)
RBC: 3.87 MIL/uL — AB (ref 4.22–5.81)
RDW: 12.4 % (ref 11.5–15.5)
WBC: 7.2 10*3/uL (ref 4.0–10.5)

## 2015-08-12 LAB — BASIC METABOLIC PANEL
Anion gap: 8 (ref 5–15)
CO2: 27 mmol/L (ref 22–32)
CREATININE: 0.87 mg/dL (ref 0.61–1.24)
Calcium: 8.7 mg/dL — ABNORMAL LOW (ref 8.9–10.3)
Chloride: 106 mmol/L (ref 101–111)
GFR calc Af Amer: >60 mL/min (ref >60)
GFR calc non Af Amer: >60 mL/min (ref >60)
Glucose, Bld: 102 mg/dL — ABNORMAL HIGH (ref 65–99)
Potassium: 3.1 mmol/L — ABNORMAL LOW (ref 3.5–5.1)
SODIUM: 141 mmol/L (ref 135–145)

## 2015-08-12 LAB — GLUCOSE, CAPILLARY
Glucose-Capillary: 98 mg/dL (ref 65–99)
Glucose-Capillary: 91 mg/dL (ref 65–99)

## 2015-08-12 MED ORDER — OXYCODONE HCL 5 MG/5ML PO SOLN
10.0000 mg | ORAL | Status: DC | PRN
  Administered 2015-08-12 – 2015-08-15 (×10): 10 mg
  Filled 2015-08-12 (×10): qty 10

## 2015-08-12 MED ORDER — PIVOT 1.5 CAL PO LIQD
1000.0000 mL | ORAL | Status: DC
  Administered 2015-08-12: 1000 mL
  Filled 2015-08-12: qty 1000

## 2015-08-12 MED ORDER — FENTANYL CITRATE (PF) 100 MCG/2ML IJ SOLN
25.0000 ug | INTRAMUSCULAR | Status: DC | PRN
  Administered 2015-08-13 – 2015-08-15 (×9): 50 ug via INTRAVENOUS
  Filled 2015-08-12 (×9): qty 2

## 2015-08-12 MED ORDER — CETYLPYRIDINIUM CHLORIDE 0.05 % MT LIQD
7.0000 mL | Freq: Two times a day (BID) | OROMUCOSAL | Status: DC
  Administered 2015-08-13 – 2015-08-19 (×10): 7 mL via OROMUCOSAL

## 2015-08-12 MED ORDER — CHLORHEXIDINE GLUCONATE 0.12 % MT SOLN
15.0000 mL | Freq: Two times a day (BID) | OROMUCOSAL | Status: DC
  Administered 2015-08-13 – 2015-08-19 (×12): 15 mL via OROMUCOSAL
  Filled 2015-08-12 (×11): qty 15

## 2015-08-12 MED ORDER — GABAPENTIN 250 MG/5ML PO SOLN
200.0000 mg | Freq: Three times a day (TID) | ORAL | Status: DC
  Administered 2015-08-12 – 2015-08-14 (×6): 200 mg
  Filled 2015-08-12 (×6): qty 4
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
## 2015-08-12 NOTE — Progress Notes (Signed)
Patient ID: Dustin House, male   DOB: August 13, 1985, 30 y.o.   MRN: [id number] X-rays reviewed. LUE and L shoulder pain may be due to L brachial plexus contusion or injury. Will try neurontin as we cannot give Lyrica per tube. I D/W him. Violeta GelinasBurke Raylyn Speckman, MD, MPH, FACS Trauma: [phone] General Surgery: [phone]

## 2015-08-13 ENCOUNTER — Inpatient Hospital Stay (HOSPITAL_COMMUNITY): Payer: Self-pay

## 2015-08-13 LAB — CBC
HCT: 31.5 % — ABNORMAL LOW (ref 39.0–52.0)
Hemoglobin: 11.2 g/dL — ABNORMAL LOW (ref 13.0–17.0)
MCH: 29.6 pg (ref 26.0–34.0)
MCHC: 35.6 g/dL (ref 30.0–36.0)
MCV: 83.1 fL (ref 78.0–100.0)
PLATELETS: 206 10*3/uL (ref 150–400)
RBC: 3.79 MIL/uL — AB (ref 4.22–5.81)
RDW: 12 % (ref 11.5–15.5)
WBC: 8.7 10*3/uL (ref 4.0–10.5)

## 2015-08-13 LAB — GLUCOSE, CAPILLARY
GLUCOSE-CAPILLARY: 121 mg/dL — AB (ref 65–99)
GLUCOSE-CAPILLARY: 96 mg/dL (ref 65–99)
GLUCOSE-CAPILLARY: 133 mg/dL — AB (ref 65–99)
Glucose-Capillary: 109 mg/dL — ABNORMAL HIGH (ref 65–99)
Glucose-Capillary: 103 mg/dL — ABNORMAL HIGH (ref 65–99)
Glucose-Capillary: 146 mg/dL — ABNORMAL HIGH (ref 65–99)
Glucose-Capillary: 110 mg/dL — ABNORMAL HIGH (ref 65–99)

## 2015-08-13 MED ORDER — DILTIAZEM HCL 25 MG/5ML IV SOLN
10.0000 mg | Freq: Once | INTRAVENOUS | Status: DC
  Filled 2015-08-13: qty 5

## 2015-08-13 MED ORDER — PIVOT 1.5 CAL PO LIQD
1000.0000 mL | ORAL | Status: DC
  Administered 2015-08-13 – 2015-08-19 (×4): 1000 mL
  Filled 2015-08-13 (×11): qty 1000
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
## 2015-08-13 NOTE — Progress Notes (Signed)
Patient ID: Dustin House, male   DOB: December 12, 1985, 30 y.o.   MRN: [id number]  There is no neck swelling or any further subcutaneous air. He is able to phonate some with 8 uncuffed trach. He may benefit with downsizing to #6 (tract is well established, can be done easily by RT). Passy_Muir valve can then be started. Continue tube feeds. Repeat swallow study next week. I'll see again after the swallow study as out patient or in hospital.
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
## 2015-08-13 NOTE — Progress Notes (Signed)
Patient ID: Dustin House, male   DOB: 12/17/1985, 30 y.o.   MRN: [id number] BP 129/84 mmHg  Pulse 98  Temp(Src) 99 F (37.2 C) (Oral)  Resp 19  Ht 6\' 1"  (1.854 m)  Wt 76.4 kg (168 lb 6.9 oz)  BMI 22.23 kg/m2  SpO2 98% Alert and oriented x 4 3/5 triceps,4/5 wrist extension, flexion, deltoids, 4/5 biceps on left side.  Normal strength on right side, normal strength in lower extremities No new recs, consistent with root injury, not a cord problem.
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
## 2015-08-13 NOTE — Progress Notes (Signed)
Proactively spoke with [facility] [name] to see if they will accept uninsured patient for nursing services upon dc.  Pt will ultimately need HHRN for trach teaching /care.  They have taken patient's information, and will get back to me.    [facility] contact #: [phone]  Quintella BatonJulie W. Lashika Erker, RN, BSN  Trauma/Neuro ICU Case Manager [phone]

## 2015-08-14 LAB — BASIC METABOLIC PANEL
Anion gap: 11 (ref 5–15)
BUN: 9 mg/dL (ref 6–20)
CHLORIDE: 104 mmol/L (ref 101–111)
CO2: 26 mmol/L (ref 22–32)
Calcium: 8.7 mg/dL — ABNORMAL LOW (ref 8.9–10.3)
Creatinine, Ser: 0.79 mg/dL (ref 0.61–1.24)
GFR calc Af Amer: >60 mL/min (ref >60)
GLUCOSE: 123 mg/dL — AB (ref 65–99)
POTASSIUM: 3.2 mmol/L — AB (ref 3.5–5.1)
Sodium: 141 mmol/L (ref 135–145)

## 2015-08-14 LAB — GLUCOSE, CAPILLARY
GLUCOSE-CAPILLARY: 110 mg/dL — AB (ref 65–99)
GLUCOSE-CAPILLARY: 117 mg/dL — AB (ref 65–99)
GLUCOSE-CAPILLARY: 106 mg/dL — AB (ref 65–99)
Glucose-Capillary: 106 mg/dL — ABNORMAL HIGH (ref 65–99)

## 2015-08-14 MED ORDER — DEXTROSE-NACL 5-0.9 % IV SOLN
INTRAVENOUS | Status: DC
  Administered 2015-08-16 – 2015-08-20 (×3): via INTRAVENOUS

## 2015-08-14 MED ORDER — POTASSIUM CHLORIDE 10 MEQ/50ML IV SOLN
10.0000 meq | INTRAVENOUS | Status: AC
  Administered 2015-08-14 (×2): 10 meq via INTRAVENOUS
  Filled 2015-08-14 (×2): qty 50

## 2015-08-14 MED ORDER — POTASSIUM CHLORIDE 20 MEQ/15ML (10%) PO SOLN
40.0000 meq | Freq: Once | ORAL | Status: AC
  Administered 2015-08-14: 40 meq via ORAL
  Filled 2015-08-14: qty 30

## 2015-08-14 MED ORDER — GABAPENTIN 250 MG/5ML PO SOLN
300.0000 mg | Freq: Three times a day (TID) | ORAL | Status: DC
  Administered 2015-08-14 – 2015-08-15 (×3): 300 mg
  Filled 2015-08-14 (×3): qty 6

## 2015-08-14 MED ORDER — PANTOPRAZOLE SODIUM 40 MG PO PACK
40.0000 mg | PACK | Freq: Every day | ORAL | Status: DC
  Administered 2015-08-15 – 2015-08-19 (×5): 40 mg
  Filled 2015-08-14 (×12): qty 20

## 2015-08-14 NOTE — Progress Notes (Signed)
Report called and given to CJ, RN on 6N.

## 2015-08-14 NOTE — Progress Notes (Signed)
Patient ID: Dustin House, male   DOB: 12/14/1985, 30 y.o.   MRN: [id number] Left upper extremity weakness consistent with root or plexus injury  Patient appears neurologically stable  Continue medical management
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
## 2015-08-15 DIAGNOSIS — S15102A Unspecified injury of left vertebral artery, initial encounter: Secondary | ICD-10-CM | POA: Diagnosis present

## 2015-08-15 DIAGNOSIS — S27819A Unspecified injury of esophagus (thoracic part), initial encounter: Secondary | ICD-10-CM | POA: Diagnosis present

## 2015-08-15 DIAGNOSIS — D62 Acute posthemorrhagic anemia: Secondary | ICD-10-CM | POA: Diagnosis not present

## 2015-08-15 DIAGNOSIS — S129XXA Fracture of neck, unspecified, initial encounter: Secondary | ICD-10-CM | POA: Diagnosis present

## 2015-08-15 LAB — GLUCOSE, CAPILLARY
GLUCOSE-CAPILLARY: 74 mg/dL (ref 65–99)
GLUCOSE-CAPILLARY: 103 mg/dL — AB (ref 65–99)
Glucose-Capillary: 105 mg/dL — ABNORMAL HIGH (ref 65–99)
Glucose-Capillary: 84 mg/dL (ref 65–99)
Glucose-Capillary: 88 mg/dL (ref 65–99)
Glucose-Capillary: 96 mg/dL (ref 65–99)

## 2015-08-15 MED ORDER — GABAPENTIN 250 MG/5ML PO SOLN
400.0000 mg | Freq: Three times a day (TID) | ORAL | Status: DC
  Administered 2015-08-15 – 2015-08-18 (×9): 400 mg
  Filled 2015-08-15 (×10): qty 8

## 2015-08-15 MED ORDER — OXYCODONE HCL 5 MG/5ML PO SOLN
10.0000 mg | ORAL | Status: DC | PRN
  Administered 2015-08-15 – 2015-08-19 (×18): 20 mg
  Administered 2015-08-19: 10 mg
  Administered 2015-08-19 – 2015-08-20 (×4): 20 mg
  Filled 2015-08-15 (×18): qty 20
  Filled 2015-08-15: qty 10
  Filled 2015-08-15 (×2): qty 20
  Filled 2015-08-15: qty 10
  Filled 2015-08-15: qty 20
  Filled 2015-08-15: qty 10

## 2015-08-15 MED ORDER — HYDROMORPHONE HCL 1 MG/ML IJ SOLN
0.5000 mg | INTRAMUSCULAR | Status: DC | PRN
  Administered 2015-08-15 – 2015-08-19 (×15): 0.5 mg via INTRAVENOUS
  Filled 2015-08-15 (×15): qty 1
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
## 2015-08-16 LAB — GLUCOSE, CAPILLARY
GLUCOSE-CAPILLARY: 108 mg/dL — AB (ref 65–99)
GLUCOSE-CAPILLARY: 92 mg/dL (ref 65–99)
GLUCOSE-CAPILLARY: 92 mg/dL (ref 65–99)
Glucose-Capillary: 91 mg/dL (ref 65–99)
Glucose-Capillary: 101 mg/dL — ABNORMAL HIGH (ref 65–99)
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
## 2015-08-17 LAB — GLUCOSE, CAPILLARY
GLUCOSE-CAPILLARY: 103 mg/dL — AB (ref 65–99)
GLUCOSE-CAPILLARY: 95 mg/dL (ref 65–99)
GLUCOSE-CAPILLARY: 110 mg/dL — AB (ref 65–99)
Glucose-Capillary: 97 mg/dL (ref 65–99)
Glucose-Capillary: 97 mg/dL (ref 65–99)

## 2015-08-17 MED ORDER — METHOCARBAMOL 1000 MG/10ML IJ SOLN
1000.0000 mg | Freq: Three times a day (TID) | INTRAVENOUS | Status: DC
  Administered 2015-08-17 – 2015-08-20 (×10): 1000 mg via INTRAVENOUS
  Filled 2015-08-17 (×13): qty 10

## 2015-08-17 MED ORDER — BISACODYL 10 MG RE SUPP: 10.0000 mg | Freq: Every day | RECTAL | Status: DC | PRN

## 2015-08-17 MED ORDER — POLYETHYLENE GLYCOL 3350 17 G PO PACK
17.0000 g | PACK | Freq: Every day | ORAL | Status: DC
  Administered 2015-08-17 – 2015-08-19 (×3): 17 g
  Filled 2015-08-17 (×3): qty 1
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
## 2015-08-18 ENCOUNTER — Inpatient Hospital Stay (HOSPITAL_COMMUNITY): Payer: MEDICAID

## 2015-08-18 LAB — BASIC METABOLIC PANEL
Anion gap: 8 (ref 5–15)
BUN: 15 mg/dL (ref 6–20)
CALCIUM: 8.9 mg/dL (ref 8.9–10.3)
CO2: 27 mmol/L (ref 22–32)
CREATININE: 0.81 mg/dL (ref 0.61–1.24)
Chloride: 106 mmol/L (ref 101–111)
GFR calc non Af Amer: >60 mL/min (ref >60)
Glucose, Bld: 272 mg/dL — ABNORMAL HIGH (ref 65–99)
Potassium: 3.8 mmol/L (ref 3.5–5.1)
SODIUM: 141 mmol/L (ref 135–145)

## 2015-08-18 LAB — CBC
HCT: 35.4 % — ABNORMAL LOW (ref 39.0–52.0)
Hemoglobin: 11.6 g/dL — ABNORMAL LOW (ref 13.0–17.0)
MCH: 28 pg (ref 26.0–34.0)
MCHC: 32.8 g/dL (ref 30.0–36.0)
MCV: 85.5 fL (ref 78.0–100.0)
PLATELETS: 393 10*3/uL (ref 150–400)
RBC: 4.14 MIL/uL — AB (ref 4.22–5.81)
RDW: 12.3 % (ref 11.5–15.5)
WBC: 9.2 10*3/uL (ref 4.0–10.5)

## 2015-08-18 LAB — GLUCOSE, CAPILLARY
GLUCOSE-CAPILLARY: 102 mg/dL — AB (ref 65–99)
GLUCOSE-CAPILLARY: 85 mg/dL (ref 65–99)
GLUCOSE-CAPILLARY: 88 mg/dL (ref 65–99)
GLUCOSE-CAPILLARY: 90 mg/dL (ref 65–99)

## 2015-08-18 MED ORDER — ACETAMINOPHEN 160 MG/5ML PO SOLN
650.0000 mg | Freq: Four times a day (QID) | ORAL | Status: DC | PRN
  Administered 2015-08-19 – 2015-08-20 (×2): 650 mg
  Filled 2015-08-18 (×2): qty 20.3

## 2015-08-18 MED ORDER — IOHEXOL 300 MG/ML  SOLN: 150.0000 mL | Freq: Once | INTRAMUSCULAR | Status: DC | PRN

## 2015-08-18 MED ORDER — ZOLPIDEM TARTRATE 5 MG PO TABS
5.0000 mg | ORAL_TABLET | Freq: Every evening | ORAL | Status: DC | PRN
  Administered 2015-08-18 – 2015-08-19 (×2): 5 mg via ORAL
  Filled 2015-08-18 (×2): qty 1

## 2015-08-18 MED ORDER — GABAPENTIN 250 MG/5ML PO SOLN
600.0000 mg | Freq: Three times a day (TID) | ORAL | Status: DC
  Administered 2015-08-18 – 2015-08-20 (×6): 600 mg
  Filled 2015-08-18 (×7): qty 12

## 2015-08-18 MED ORDER — FENTANYL 50 MCG/HR TD PT72
50.0000 ug | MEDICATED_PATCH | TRANSDERMAL | Status: DC
  Administered 2015-08-18: 50 ug via TRANSDERMAL
  Filled 2015-08-18: qty 1
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
## 2015-08-18 NOTE — Progress Notes (Signed)
Pt a/o, c/o pain PRN meds given as ordered, pt educated on pain meds and med changes that were made to help with pain management, pt NPO has tube feed, awaiting study to see if esophagus still has tear, VSS, pt stable

## 2015-08-19 LAB — GLUCOSE, CAPILLARY
GLUCOSE-CAPILLARY: 106 mg/dL — AB (ref 65–99)
GLUCOSE-CAPILLARY: 68 mg/dL (ref 65–99)
GLUCOSE-CAPILLARY: 120 mg/dL — AB (ref 65–99)
Glucose-Capillary: 101 mg/dL — ABNORMAL HIGH (ref 65–99)
Glucose-Capillary: 108 mg/dL — ABNORMAL HIGH (ref 65–99)
Glucose-Capillary: 115 mg/dL — ABNORMAL HIGH (ref 65–99)
Glucose-Capillary: 97 mg/dL (ref 65–99)

## 2015-08-19 MED ORDER — ASPIRIN 325 MG PO TABS
325.0000 mg | ORAL_TABLET | Freq: Every day | ORAL | Status: DC
  Administered 2015-08-19: 325 mg
  Filled 2015-08-19: qty 1
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
## 2015-08-19 NOTE — Progress Notes (Signed)
Pt in bathroom  Off o2.

## 2015-08-19 NOTE — Progress Notes (Signed)
Hypoglycemic Event  CBG: 68  Treatment: 15 GM carbohydrate snack  Symptoms: None  Follow-up CBG: Time:0903 CBG Result:120  Possible Reasons for Event: Inadequate meal intake  Comments/MD notified:RN notified of low CBG at 0845. Dustin GuildMegan Baird, PA notified.     Dustin House B
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
## 2015-08-19 NOTE — Progress Notes (Signed)
Patient ID: Dustin House, male   DOB: [birth year], 30 y.o.   MRN: [id number]  Swallowing study reviewed. He continues to remain afebrile, with a normal white blood cell count, and no swelling of the neck. He has a #6 tracheostomy in place and is able to phonate very nicely. He has a strong voice and has a hard glottic stop when he coughs. This is all indication that the larynx itself is doing very well. He is able to breathe with the tracheostomy obstructed. This is a good indicator of the cricoid area and upper tracheal area as far as the airway is concerned.  Recommend we continue with the nasogastric feeding, remain nothing by mouth. Her graft long-term plan is:  1. Downsize the tracheostomy to a #4 in a couple of weeks. Reevaluate for possible decannulation after that.  2. Repeat a swallowing study in 2-3 weeks.  All of this can be managed as an outpatient if the social situation will allow him to be discharged.

## 2015-08-19 NOTE — Progress Notes (Signed)
Pt continues to disconnect PICC line from tubing intermittently. I have encouraged him to call RN with any IV line needs and explained risks of disconnecting tubing on his own.
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
## 2015-08-19 NOTE — Progress Notes (Signed)
Physical Therapy Discharge Patient Details Name: Dustin House Xxxharrison MRN: [id number] DOB: Oct 29, 1985 Today's Date: 08/19/2015 Time:  -     Patient discharged from PT services secondary to per OT, patient has been mobilizing independently around the unit without any difficulty. No further Acute PT needs.  Please see latest therapy progress note for current level of functioning and progress toward goals.    Progress and discharge plan discussed with patient and/or caregiver: Patient/Caregiver agrees with plan  GP     Leola BrazilWerner, Chanceler Pullin J  Rogers Ditter, PT DPT  [phone]  08/19/2015, 5:07 PM
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
## 2015-08-20 LAB — GLUCOSE, CAPILLARY
GLUCOSE-CAPILLARY: 122 mg/dL — AB (ref 65–99)
Glucose-Capillary: 93 mg/dL (ref 65–99)
Glucose-Capillary: 96 mg/dL (ref 65–99)

## 2015-08-20 MED ORDER — FREE WATER: 150.0000 mL | Freq: Every day | Status: AC

## 2015-08-20 MED ORDER — ASPIRIN 325 MG PO TABS: 325.0000 mg | ORAL_TABLET | Freq: Every day | ORAL | Status: AC

## 2015-08-20 MED ORDER — OXYCODONE HCL 5 MG/5ML PO SOLN: 10.0000 mg | ORAL | Status: AC | PRN

## 2015-08-20 MED ORDER — CHLORHEXIDINE GLUCONATE 0.12 % MT SOLN: 15.0000 mL | Freq: Two times a day (BID) | OROMUCOSAL | Status: AC

## 2015-08-20 MED ORDER — ACETAMINOPHEN 160 MG/5ML PO SOLN: 650.0000 mg | Freq: Four times a day (QID) | ORAL | Status: AC | PRN

## 2015-08-20 MED ORDER — BISACODYL 10 MG RE SUPP: 10.0000 mg | Freq: Every day | RECTAL | Status: AC | PRN

## 2015-08-20 MED ORDER — JEVITY 1.5 CAL/FIBER PO LIQD: 237.0000 mL | Freq: Every day | ORAL | Status: AC

## 2015-08-20 MED ORDER — FREE WATER
150.0000 mL | Freq: Every day | Status: DC
  Administered 2015-08-20: 150 mL

## 2015-08-20 MED ORDER — JEVITY 1.5 CAL/FIBER PO LIQD
237.0000 mL | Freq: Every day | ORAL | Status: DC
  Filled 2015-08-20 (×9): qty 1000

## 2015-08-20 MED ORDER — METHOCARBAMOL 500 MG PO TABS: 1000.0000 mg | ORAL_TABLET | Freq: Three times a day (TID) | ORAL | Status: AC | PRN

## 2015-08-20 MED ORDER — FENTANYL 50 MCG/HR TD PT72: 50.0000 ug | MEDICATED_PATCH | TRANSDERMAL | Status: AC

## 2015-08-20 MED ORDER — GABAPENTIN 250 MG/5ML PO SOLN: 600.0000 mg | Freq: Three times a day (TID) | ORAL | Status: AC

## 2015-08-20 MED ORDER — METHOCARBAMOL 500 MG PO TABS: 1000.0000 mg | ORAL_TABLET | Freq: Three times a day (TID) | ORAL | Status: DC | PRN

## 2015-08-20 MED ORDER — POLYETHYLENE GLYCOL 3350 17 G PO PACK: 17.0000 g | PACK | Freq: Every day | ORAL | Status: AC
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
## 2017-02-28 IMAGING — CR DG CHEST 1V PORT
1 series · 1 of 1 positions shown · non-contrast
Comparison: 08/09/2015

CLINICAL DATA: Respiratory failure

EXAM:
PORTABLE CHEST 1 VIEW

[AP]
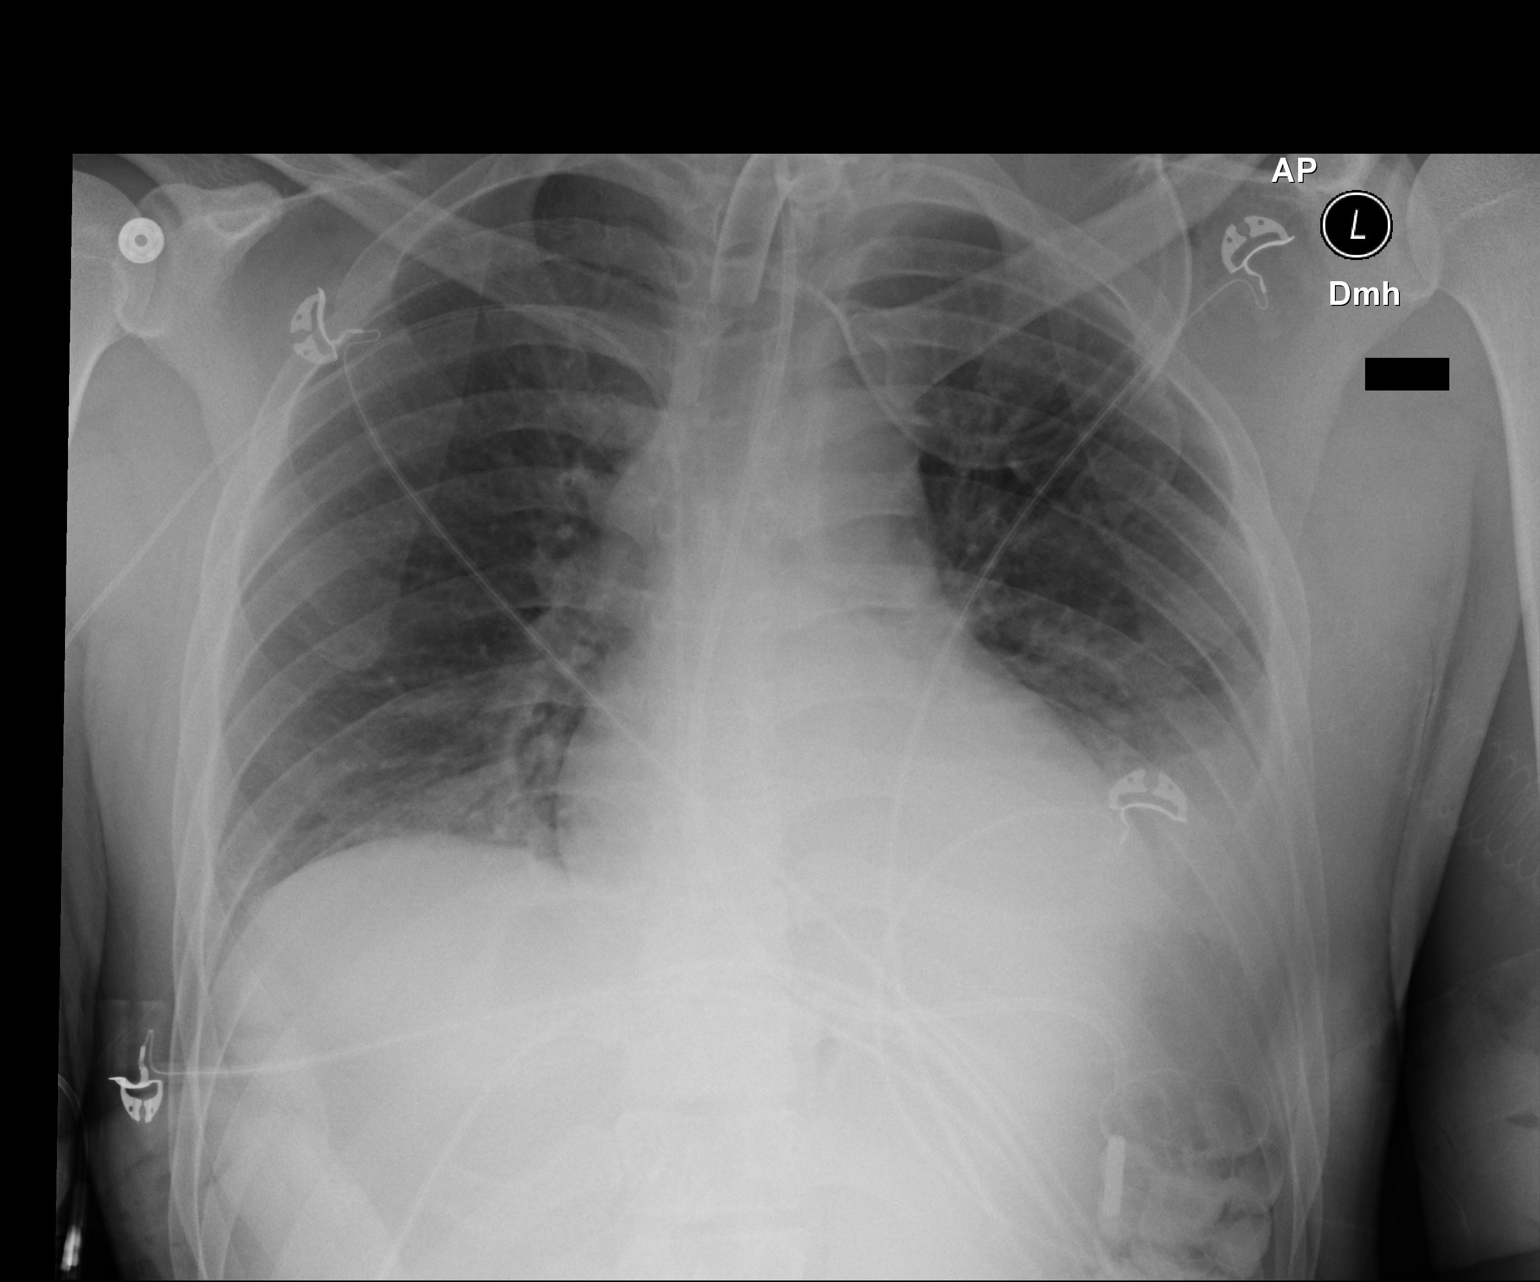

[1 of 1 positions shown; findings below may reference images not displayed]

FINDINGS: Dobbhoff tube projects over the stomach. Tracheostomy tube projects
over the trachea. Right PICC line identified with tip into the right
atrium, about 3 cm beyond the cavoatrial junction.

Heart size and vascular pattern normal. Mild infiltrate medial right
base likely atelectasis. More significant opacity at the left base
suggesting combination of airspace disease and pleural effusion.
IMPRESSION: 1. Right PICC line as described with tip 3 cm into the right atrium.
Consider retracting the PICC line about 2-3 cm and repeating the
film.
2. Bibasilar opacities left greater than right. Opacity left lung
base similar to 08/09/2015, slightly more prominent, suggesting a
combination of pleural effusion and consolidation.
